# Patient Record
Sex: Female | Born: 1950 | Race: White | Hispanic: No | Marital: Married | Smoking: Current every day smoker
Health system: Southern US, Community
[De-identification: ages and names within clinical notes are randomized; demographics above are authoritative.]

## PROBLEM LIST (undated history)

## (undated) DIAGNOSIS — I10 Essential (primary) hypertension: Secondary | ICD-10-CM

---

## 2014-04-21 ENCOUNTER — Encounter: Payer: Self-pay | Admitting: *Deleted

## 2014-04-21 ENCOUNTER — Emergency Department (INDEPENDENT_AMBULATORY_CARE_PROVIDER_SITE_OTHER): Payer: BLUE CROSS/BLUE SHIELD

## 2014-04-21 ENCOUNTER — Emergency Department
Admission: EM | Admit: 2014-04-21 | Discharge: 2014-04-21 | Disposition: A | Discharge status: Home / Self Care | Payer: BLUE CROSS/BLUE SHIELD | Source: Home / Self Care | Attending: Emergency Medicine | Admitting: Emergency Medicine

## 2014-04-21 DIAGNOSIS — S93401A Sprain of unspecified ligament of right ankle, initial encounter: Secondary | ICD-10-CM | POA: Diagnosis not present

## 2014-04-21 DIAGNOSIS — S92901A Unspecified fracture of right foot, initial encounter for closed fracture: Secondary | ICD-10-CM

## 2014-04-21 DIAGNOSIS — X58XXXA Exposure to other specified factors, initial encounter: Secondary | ICD-10-CM

## 2014-04-21 DIAGNOSIS — S93409A Sprain of unspecified ligament of unspecified ankle, initial encounter: Secondary | ICD-10-CM | POA: Diagnosis not present

## 2014-04-21 DIAGNOSIS — S92002A Unspecified fracture of left calcaneus, initial encounter for closed fracture: Secondary | ICD-10-CM

## 2014-04-21 DIAGNOSIS — M25572 Pain in left ankle and joints of left foot: Secondary | ICD-10-CM

## 2014-04-21 MED ORDER — IBUPROFEN 200 MG PO TABS: ORAL_TABLET | ORAL | Status: DC

## 2014-04-21 NOTE — ED Provider Notes (Signed)
CSN: [id number]     Arrival date & time 04/21/14  1202 History   First MD Initiated Contact with Patient 04/21/14 1246     Chief Complaint  Patient presents with  . Foot Injury    L  . Ankle Injury    L   (Consider location/radiation/quality/duration/timing/severity/associated sxs/prior Treatment) HPI At the time, was wearing high heels.  Pt fell going up steps 3 days ago. And has pain 2 out of 10 at rest (5/10 sharp pain with weight bearing) and swelling in her L foot and ankle. Has continued to work, try to rest it and applied ice, but still has pain and swelling. No paresthesias or weakness. Denies cardiorespiratory or GI symptoms History reviewed. No pertinent past medical history. History reviewed. No pertinent past surgical history. History reviewed. No pertinent family history. History  Substance Use Topics  . Smoking status: Current Every Day Smoker -- 1.00 packs/day    Types: Cigarettes  . Smokeless tobacco: Not on file  . Alcohol Use: No   OB History    No data available     Review of Systems  All other systems reviewed and are negative.   Allergies  Review of patient's allergies indicates no known allergies.  Home Medications   Prior to Admission medications   Medication Sig Start Date End Date Taking? Authorizing Provider  ibuprofen (ADVIL,MOTRIN) 200 MG tablet Take three tablets ( 600 milligrams total) every 6 with food as needed for pain. 04/21/14   Lajean Manesavid Massey, MD   BP 154/83 mmHg  Pulse 72  Temp(Src) 98 F (36.7 C) (Oral)  Ht 5\' 4"  (1.626 m)  Wt 123 lb (55.792 kg)  BMI 21.10 kg/m2  SpO2 98% Physical Exam  Constitutional: She is oriented to person, place, and time. She appears well-developed and well-nourished. No distress.  HENT:  Head: Normocephalic and atraumatic.  Eyes: Conjunctivae and EOM are normal. Pupils are equal, round, and reactive to light. No scleral icterus.  Neck: Normal range of motion.  Cardiovascular: Normal rate.    Pulmonary/Chest: Effort normal.  Abdominal: She exhibits no distension.  Neurological: She is alert and oriented to person, place, and time.  Skin: Skin is warm.  Psychiatric: She has a normal mood and affect.  Nursing note and vitals reviewed.  Musculoskeletal: Painful tender swollen ecchymotic diffusely left foot and left lateral ankle. Pain exacerbated by attempts at range of motion, she has decreased range of motion. Neurovascular distally intact. ED Course  Procedures (including critical care time) Labs Review Labs Reviewed - No data to display  Imaging Review Dg Ankle Complete Left  04/21/2014   CLINICAL DATA:  Pain lt ft at Lateral malleolus and across base of anterior metatarsals after a fall 3 days ago. Initial encounter both ankle and foot viewed today.  EXAM: LEFT ANKLE COMPLETE - 3+ VIEW  COMPARISON:  None.  FINDINGS: There is no evidence of fracture, dislocation, or joint effusion. There is no evidence of arthropathy or other focal bone abnormality. Soft tissues are unremarkable.  IMPRESSION: Negative.   Electronically Signed   By: Amie Portlandavid  Ormond M.D.   On: 04/21/2014 13:55   Dg Foot Complete Left  04/21/2014   CLINICAL DATA:  Pain lt ft at Lateral malleolus and across base of anterior metatarsals after a fall 3 days ago. Initial encounter both ankle and foot viewed today.  EXAM: LEFT FOOT - COMPLETE 3+ VIEW  COMPARISON:  None.  FINDINGS: Subtle avulsion fracture noted along the lateral margin of the anterior  calcaneus with adjacent soft tissue swelling.  No other evidence of a fracture. Joints are normally spaced and aligned.  IMPRESSION: Small avulsion fracture from the lateral anterior aspect of the calcaneus.   Electronically Signed   By: Amie Portland M.D.   On: 04/21/2014 13:54     MDM   1. Fracture of right foot, closed, initial encounter   2. Right ankle sprain, initial encounter    IMPRESSION: Small avulsion fracture from the lateral anterior aspect of the  calcaneus. Also clinically quite tender swollen ecchymotic fourth and fifth left metatarsal, although no fracture seen on x-ray. Treatment options discussed, as well as risks, benefits, alternatives. Patient voiced understanding and agreement with the following plans: Ace bandage Postop shoe Ibuprofen for pain She declined prescription pain med She declined crutches. She is using a cane to avoid weightbearing. Follow-up orthopedist within 1 week Precautions discussed. Red flags discussed. Questions invited and answered. Patient voiced understanding and agreement.    Lajean Manes, MD 04/21/14 2233

## 2014-04-21 NOTE — ED Notes (Signed)
Pt fell going up her steps 3 days ago. And has pain (2/10weight bearing) and swelling in her L foot and ankle.

## 2016-08-30 ENCOUNTER — Encounter: Payer: Self-pay | Admitting: *Deleted

## 2016-08-30 ENCOUNTER — Emergency Department
Admission: EM | Admit: 2016-08-30 | Discharge: 2016-08-30 | Disposition: A | Discharge status: Home / Self Care | Payer: BLUE CROSS/BLUE SHIELD | Source: Home / Self Care | Attending: Family Medicine | Admitting: Family Medicine

## 2016-08-30 ENCOUNTER — Emergency Department (INDEPENDENT_AMBULATORY_CARE_PROVIDER_SITE_OTHER): Payer: BLUE CROSS/BLUE SHIELD

## 2016-08-30 DIAGNOSIS — S93402A Sprain of unspecified ligament of left ankle, initial encounter: Secondary | ICD-10-CM | POA: Diagnosis not present

## 2016-08-30 DIAGNOSIS — M25572 Pain in left ankle and joints of left foot: Secondary | ICD-10-CM

## 2016-08-30 DIAGNOSIS — S93602A Unspecified sprain of left foot, initial encounter: Secondary | ICD-10-CM

## 2016-08-30 NOTE — ED Triage Notes (Signed)
Pt c/o LT foot and ankle pain post fall today at 0915. Took IBF at 1400 today.

## 2016-08-30 NOTE — ED Provider Notes (Signed)
[facility]    CSN: [id number] Arrival date & time: 08/30/16  1731     History   Chief Complaint Chief Complaint  Patient presents with  . Foot Pain    HPI Courtney Nunez is a 66 y.o. female.   Patient stepped off a curb about 9 hours ago and inverted her left foot/ankle.  She has had persistent pain in her left ankle and top of foot.    Ankle Pain  Location:  Ankle and foot Time since incident:  9 hours Injury: yes   Mechanism of injury comment:  Inverted foot/ankle Ankle location:  L ankle Foot location:  Dorsum of L foot Pain details:    Quality:  Aching   Radiates to:  Does not radiate   Severity:  Moderate   Onset quality:  Sudden   Duration:  9 hours   Timing:  Constant   Progression:  Unchanged Chronicity:  Recurrent Prior injury to area:  Yes Relieved by:  None tried Worsened by:  Bearing weight Ineffective treatments:  NSAIDs Associated symptoms: decreased ROM, stiffness and swelling   Associated symptoms: no back pain, no numbness and no tingling     History reviewed. No pertinent past medical history.  There are no active problems to display for this patient.   History reviewed. No pertinent surgical history.  OB History    No data available       Home Medications    Prior to Admission medications   Medication Sig Start Date End Date Taking? Authorizing Provider  ibuprofen (ADVIL,MOTRIN) 200 MG tablet Take three tablets ( 600 milligrams total) every 6 with food as needed for pain. 04/21/14   Lajean ManesMassey, David, MD    Family History Family History  Problem Relation Age of Onset  . Cancer Father   . Heart attack Brother     Social History Social History  Substance Use Topics  . Smoking status: Current Every Day Smoker    Packs/day: 1.00    Types: Cigarettes  . Smokeless tobacco: Never Used  . Alcohol use No     Allergies   Patient has no known allergies.   Review of Systems Review of Systems  Musculoskeletal:  Positive for stiffness. Negative for back pain.  All other systems reviewed and are negative.    Physical Exam Triage Vital Signs ED Triage Vitals  Enc Vitals Group     BP 08/30/16 1758 (!) 154/80     Pulse Rate 08/30/16 1758 70     Resp 08/30/16 1758 16     Temp 08/30/16 1758 98 F (36.7 C)     Temp Source 08/30/16 1758 Oral     SpO2 08/30/16 1758 96 %     Weight 08/30/16 1759 130 lb (59 kg)     Height 08/30/16 1759 5\' 4"  (1.626 m)     Head Circumference --      Peak Flow --      Pain Score 08/30/16 1759 7     Pain Loc --      Pain Edu? --      Excl. in GC? --    No data found.   Updated Vital Signs BP (!) 154/80 (BP Location: Left Arm)   Pulse 70   Temp 98 F (36.7 C) (Oral)   Resp 16   Ht 5\' 4"  (1.626 m)   Wt 130 lb (59 kg)   SpO2 96%   BMI 22.31 kg/m   Visual Acuity Right Eye Distance:  Left Eye Distance:   Bilateral Distance:    Right Eye Near:   Left Eye Near:    Bilateral Near:     Physical Exam  Constitutional: She appears well-developed and well-nourished. No distress.  HENT:  Head: Normocephalic.  Eyes: Pupils are equal, round, and reactive to light. Conjunctivae are normal.  Neck: Normal range of motion.  Cardiovascular: Normal rate.   Pulmonary/Chest: Effort normal.  Musculoskeletal:       Left ankle: She exhibits decreased range of motion. She exhibits no swelling, no ecchymosis, no deformity, no laceration and normal pulse. Tenderness. Lateral malleolus and proximal fibula tenderness found. No medial malleolus, no AITFL, no CF ligament and no head of 5th metatarsal tenderness found. Achilles tendon normal.       Left foot: There is tenderness and bony tenderness. There is no swelling, normal capillary refill and no deformity.       Feet:  Left ankle:  Decreased range of motion.  Tenderness but no swelling over the lateral malleolus.  Tenderness extends to dorsal foot over tarsals.  Ankle joint stable.  No tenderness over the base of the  fifth metatarsal.  Distal neurovascular function is intact.   Neurological: She is alert.  Skin: Skin is warm and dry.  Nursing note and vitals reviewed.    UC Treatments / Results  Labs (all labs ordered are listed, but only abnormal results are displayed) Labs Reviewed - No data to display  EKG  EKG Interpretation None       Radiology Dg Ankle Complete Left  Result Date: 08/30/2016 CLINICAL DATA:  66 year old female with trauma to the left ankle. EXAM: LEFT FOOT - COMPLETE 3+ VIEW; LEFT ANKLE COMPLETE - 3+ VIEW COMPARISON:  Left foot radiograph dated 04/21/2014 FINDINGS: There is no acute fracture or dislocation. The ankle mortise is intact. There is for or no significant soft tissue swelling. IMPRESSION: Negative. Electronically Signed   By: Elgie Collard M.D.   On: 08/30/2016 18:38   Dg Foot Complete Left  Result Date: 08/30/2016 CLINICAL DATA:  66 year old female with trauma to the left ankle. EXAM: LEFT FOOT - COMPLETE 3+ VIEW; LEFT ANKLE COMPLETE - 3+ VIEW COMPARISON:  Left foot radiograph dated 04/21/2014 FINDINGS: There is no acute fracture or dislocation. The ankle mortise is intact. There is for or no significant soft tissue swelling. IMPRESSION: Negative. Electronically Signed   By: Elgie Collard M.D.   On: 08/30/2016 18:38    Procedures Procedures (including critical care time)  Medications Ordered in UC Medications - No data to display   Initial Impression / Assessment and Plan / UC Course  I have reviewed the triage vital signs and the nursing notes.  Pertinent labs & imaging results that were available during my care of the patient were reviewed by me and considered in my medical decision making (see chart for details).    Applied ace wrap and AirCast stirrup splint. Apply ice pack for 30 minutes every 1 to 2 hours today and tomorrow.  Elevate.  Wear Ace wrap until swelling decreases.  Wear brace for about 2 to 3 weeks.  Begin range of motion and  stretching exercises in about 5 days as per instruction sheet.  May take Ibuprofen 200mg , 4 tabs every 8 hours with food.  Followup with Dr. Rodney Langton or Dr. Clementeen [facility] ([facility]) if not improving about two weeks.     Final Clinical Impressions(s) / UC Diagnoses   Final diagnoses:  Sprain of left  ankle, unspecified ligament, initial encounter  Foot sprain, left, initial encounter    New Prescriptions New Prescriptions   No medications on file     Lattie HawBeese, Stephen A, MD 08/31/16 1100

## 2016-08-30 NOTE — Discharge Instructions (Signed)
Apply ice pack for 30 minutes every 1 to 2 hours today and tomorrow.  Elevate.  Wear Ace wrap until swelling decreases.  Wear brace for about 2 to 3 weeks.  Begin range of motion and stretching exercises in about 5 days as per instruction sheet.  May take Ibuprofen 200mg , 4 tabs every 8 hours with food.

## 2016-11-26 ENCOUNTER — Encounter: Payer: Self-pay | Admitting: *Deleted

## 2016-11-26 ENCOUNTER — Emergency Department
Admission: EM | Admit: 2016-11-26 | Discharge: 2016-11-26 | Disposition: A | Discharge status: Home / Self Care | Payer: BLUE CROSS/BLUE SHIELD | Source: Home / Self Care | Attending: Family Medicine | Admitting: Family Medicine

## 2016-11-26 DIAGNOSIS — S0181XA Laceration without foreign body of other part of head, initial encounter: Secondary | ICD-10-CM

## 2016-11-26 DIAGNOSIS — S61411A Laceration without foreign body of right hand, initial encounter: Secondary | ICD-10-CM

## 2016-11-26 DIAGNOSIS — Z23 Encounter for immunization: Secondary | ICD-10-CM

## 2016-11-26 MED ORDER — LIDOCAINE-EPINEPHRINE-TETRACAINE (LET) SOLUTION
3.0000 mL | Freq: Once | NASAL | Status: AC
  Administered 2016-11-26: 3 mL via TOPICAL

## 2016-11-26 MED ORDER — TETANUS-DIPHTH-ACELL PERTUSSIS 5-2.5-18.5 LF-MCG/0.5 IM SUSP
0.5000 mL | Freq: Once | INTRAMUSCULAR | Status: AC
  Administered 2016-11-26: 0.5 mL via INTRAMUSCULAR

## 2016-11-26 NOTE — ED Provider Notes (Signed)
[facility]    CSN: [id number] Arrival date & time: 11/26/16  1016     History   Chief Complaint Chief Complaint  Patient presents with  . Laceration  . Fall    HPI Courtney Nunez is a 66 y.o. female.   HPI Courtney Nunez is a 66 y.o. female presenting to UC with c/o laceration to the Right side of her face and Right hand that occurred this morning.  Pt was carrying a crock pot, slipped on her porch, hitting her face on the ground.  The lid of the crock pot shattered so she believes that is what cut her face and hand.  Denies LOC.  She does have partial dentures and notes they did come out during the fall but denies jaw soreness or gum bleeding.  Denies HA, nausea or change in vision. She is not on blood thinners.    History reviewed. No pertinent past medical history.  There are no active problems to display for this patient.   History reviewed. No pertinent surgical history.  OB History    No data available       Home Medications    Prior to Admission medications   Not on File    Family History Family History  Problem Relation Age of Onset  . Cancer Father   . Heart attack Brother     Social History Social History  Substance Use Topics  . Smoking status: Current Every Day Smoker    Packs/day: 1.00    Types: Cigarettes  . Smokeless tobacco: Never Used  . Alcohol use No     Allergies   Patient has no known allergies.   Review of Systems Review of Systems  HENT: Positive for facial swelling.   Eyes: Negative for pain, redness and visual disturbance.  Cardiovascular: Negative for chest pain and palpitations.  Musculoskeletal: Negative for arthralgias and joint swelling.  Skin: Positive for color change and wound.  Neurological: Negative for dizziness, light-headedness and headaches.     Physical Exam Triage Vital Signs ED Triage Vitals  Enc Vitals Group     BP 11/26/16 1057 (!) 153/83     Pulse Rate 11/26/16 1057 71     Resp  --      Temp --      Temp src --      SpO2 11/26/16 1057 98 %     Weight 11/26/16 1058 139 lb (63 kg)     Height --      Head Circumference --      Peak Flow --      Pain Score 11/26/16 1058 2     Pain Loc --      Pain Edu? --      Excl. in GC? --    No data found.   Updated Vital Signs BP (!) 153/83 (BP Location: Left Arm)   Pulse 71   Wt 139 lb (63 kg)   SpO2 98%   BMI 23.86 kg/m   Visual Acuity Right Eye Distance:   Left Eye Distance:   Bilateral Distance:    Right Eye Near:   Left Eye Near:    Bilateral Near:     Physical Exam  Constitutional: She is oriented to person, place, and time. She appears well-developed and well-nourished. No distress.  HENT:  Head: Normocephalic. Head is with contusion and with laceration.    Nose: Nose normal. No nose lacerations, sinus tenderness, nasal deformity, septal deviation or nasal septal hematoma.  Faint ecchymosis and mild edema under Right eye.  3cm laceration. No foreign body noted. Bleeding controlled.  Eyes: EOM are normal.  Neck: Normal range of motion.  Cardiovascular: Normal rate.   Pulmonary/Chest: Effort normal.  Musculoskeletal: Normal range of motion.  Neurological: She is alert and oriented to person, place, and time.  Skin: Skin is warm and dry. She is not diaphoretic.  Psychiatric: She has a normal mood and affect. Her behavior is normal.  Nursing note and vitals reviewed.    UC Treatments / Results  Labs (all labs ordered are listed, but only abnormal results are displayed) Labs Reviewed - No data to display  EKG  EKG Interpretation None       Radiology No results found.  Procedures Procedures (including critical care time)  Laceration to Right side of face on cheek sutured closed by PA-student Corwin LevinsSelby Rouch with verbal permission by the patient under my Waylan Rocher(Arvon Schreiner, PA-C) direct supervision. Discussed risk of scaring and poor wound healing with pt.  Wound draped in sterile fashion. LET  applied for 15 minutes providing moderate pain relief.  Additional 1mL Lidocaine 1% w/o epi used for complete anesthetic effect.  Wound flushed with sterile saline. Full depth of wound explored. No foreign bodies visualized or palpated. No muscle involvement.  Wound sutured closed with five 5-0 Ethylon sutures. Bacitracin and bandage applied. Pt tolerated procedure well, no immediate complications.    Wound on Right hand cleaned with warm water and Hibiclens. No foreign bodies seen or palpated. Wound is superficial. No indication for wound closure at this time. Bacitracin and bandage applied.   Medications Ordered in UC Medications  lidocaine-EPINEPHrine-tetracaine (LET) solution (3 mLs Topical Given 11/26/16 1144)  Tdap (BOOSTRIX) injection 0.5 mL (0.5 mLs Intramuscular Given 11/26/16 1144)     Initial Impression / Assessment and Plan / UC Course  I have reviewed the triage vital signs and the nursing notes.  Pertinent labs & imaging results that were available during my care of the patient were reviewed by me and considered in my medical decision making (see chart for details).     Slip and fall at home with Right side facial laceration. Pt is alert, oriented and cooperative. No LOC. No nausea, vomiting, dizziness or change in vision. Not on blood thinners. No indication for imaging at this time.  Wound on face sutured closed as detailed above. Tdap updated in UC today Return in 4-5 days for suture removal. hom care instruction packet provided. F/u sooner if needed.   Final Clinical Impressions(s) / UC Diagnoses   Final diagnoses:  Facial laceration, initial encounter  Superficial laceration of right hand, initial encounter    New Prescriptions There are no discharge medications for this patient.    Controlled Substance Prescriptions [place] Controlled Substance Registry consulted? Not Applicable   Lurene Shadowhelps, Taronda Comacho O, PA-C 11/26/16 1224

## 2016-11-26 NOTE — ED Triage Notes (Signed)
Patient reports falling on concrete this AM. She had her arms full with a crock pot, she fell face first.  The crock pot lid shattered. She has a laceration to her right cheek bone and her right hand. TDaP is unknown.

## 2016-11-28 ENCOUNTER — Telehealth: Payer: Self-pay | Admitting: Emergency Medicine

## 2016-11-28 NOTE — Telephone Encounter (Signed)
Patient states her lacerations and bruises are doing well. She is uncertain where she will have sutures removed.

## 2018-07-08 IMAGING — DX DG ANKLE COMPLETE 3+V*L*
3 series · 3 of 3 positions shown · non-contrast
Comparison: Left foot radiograph dated 04/21/2014

CLINICAL DATA: 66-year-old female with trauma to the left ankle.

EXAM:
LEFT FOOT - COMPLETE 3+ VIEW; LEFT ANKLE COMPLETE - 3+ VIEW

[ankle ap]
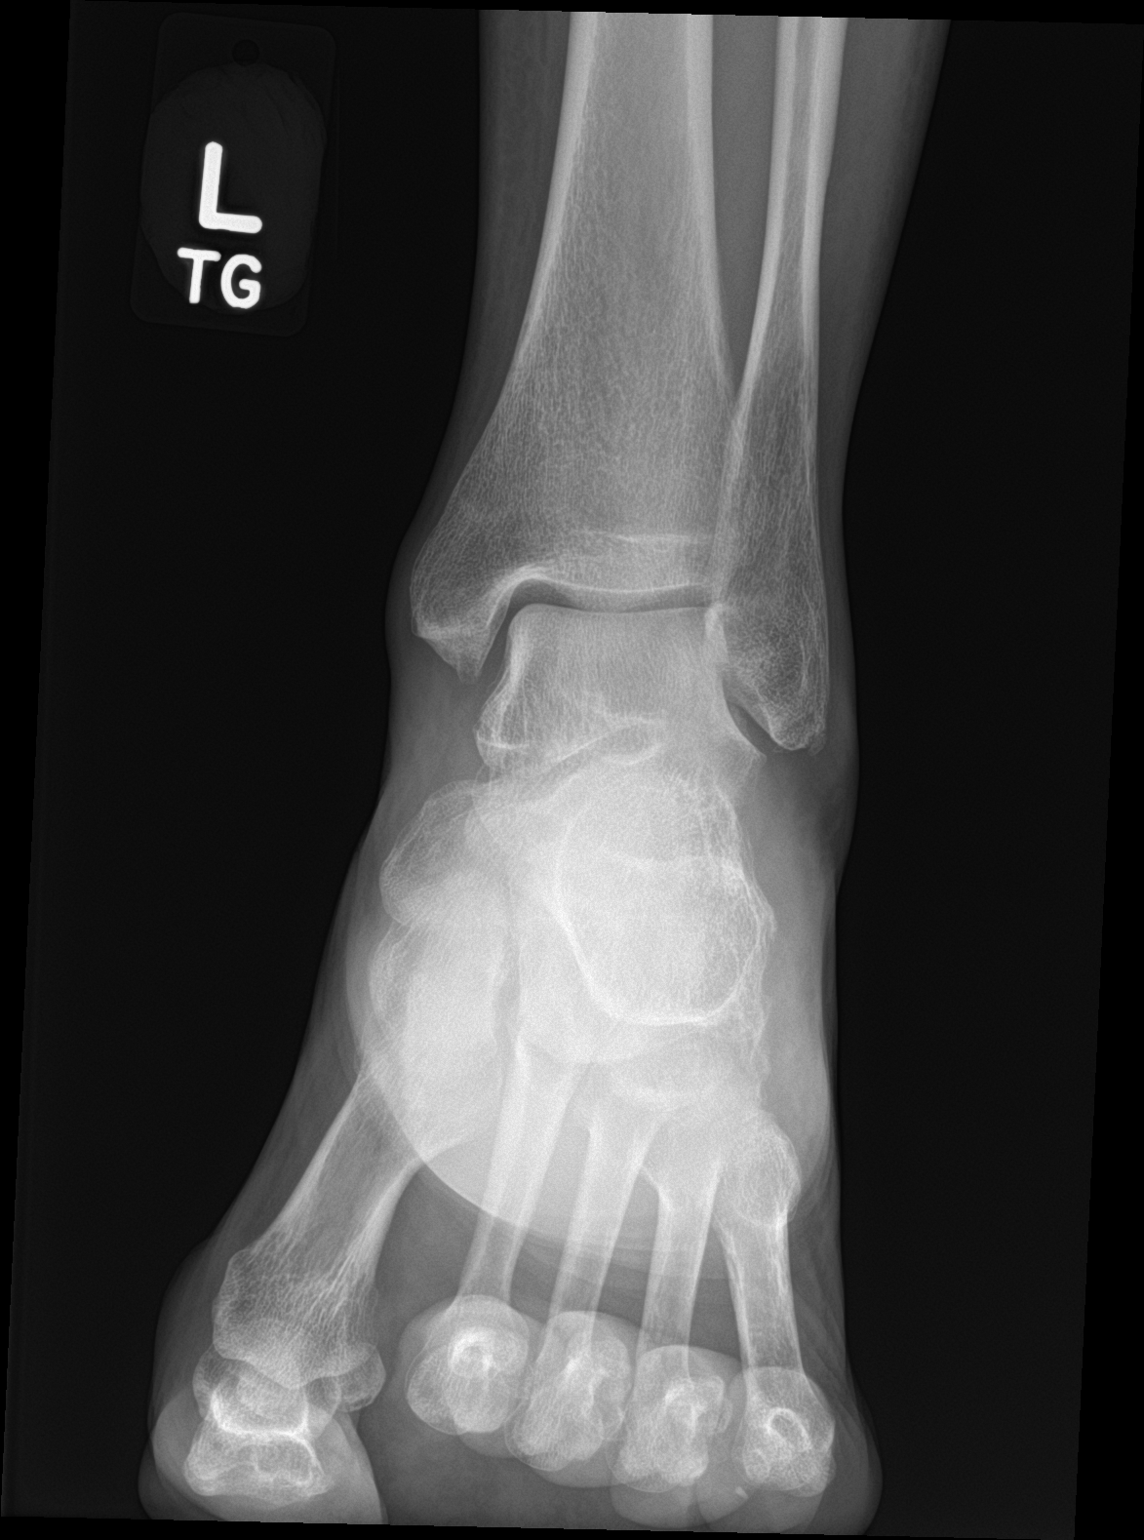

[ankle obl]
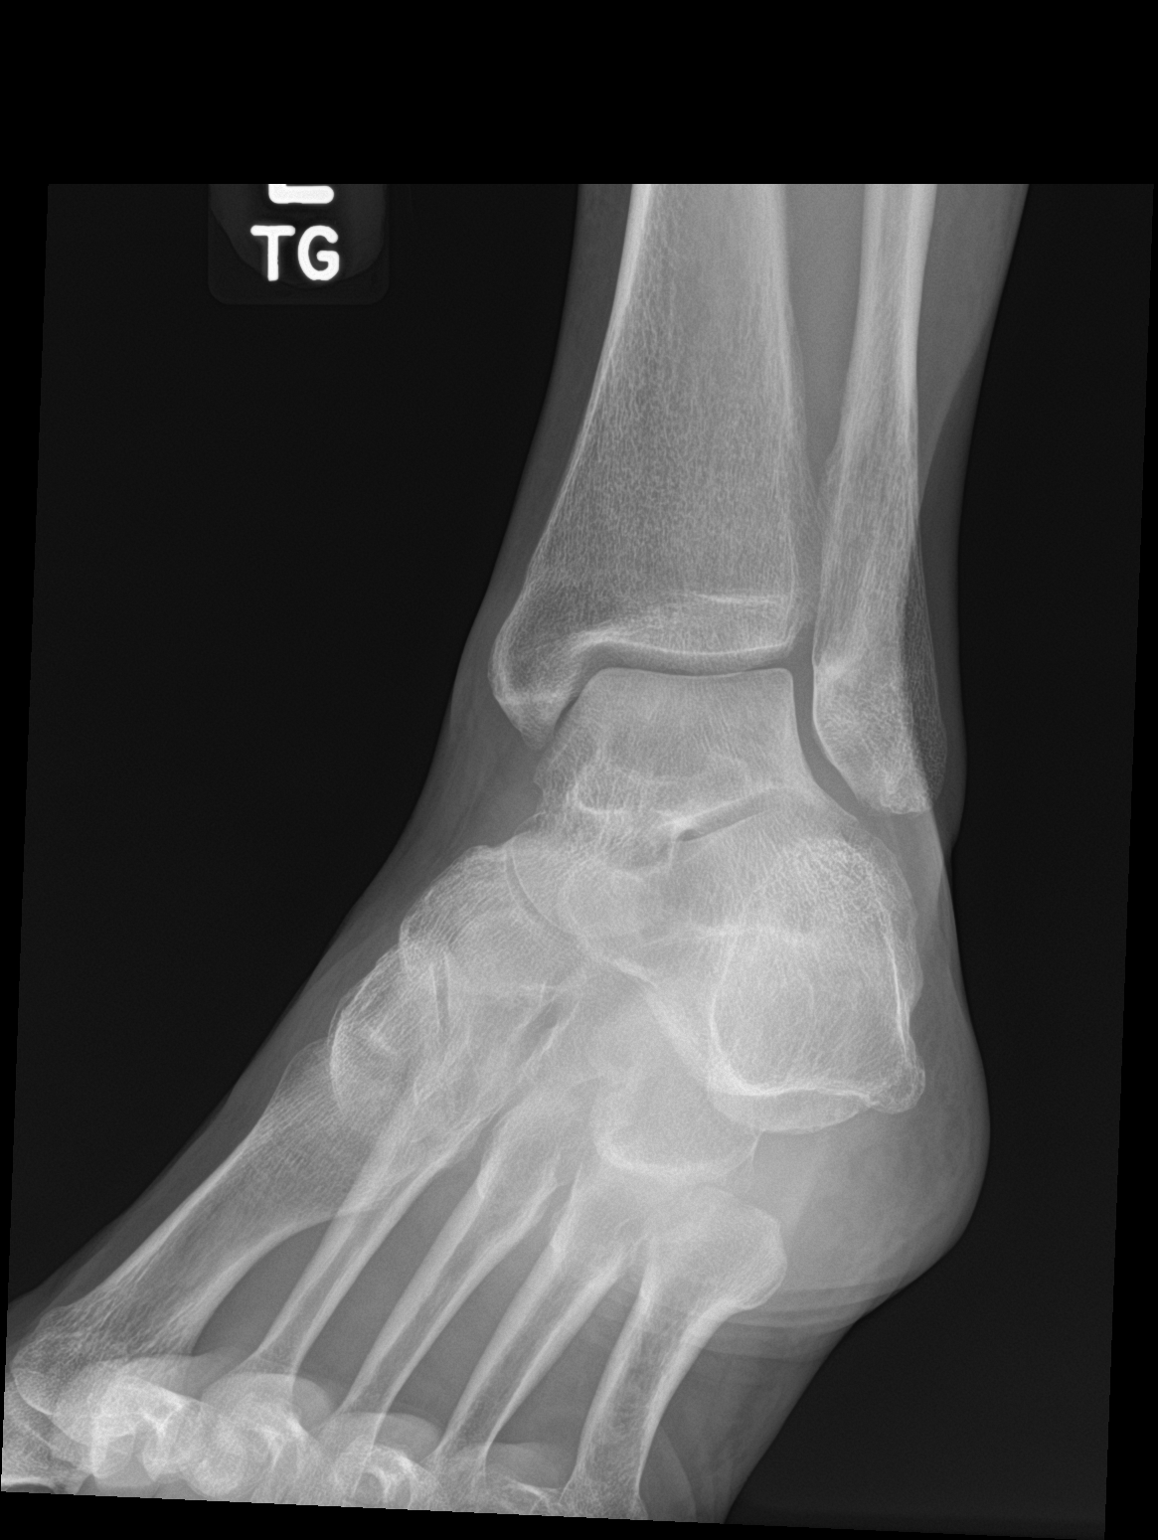

[ankle lat]
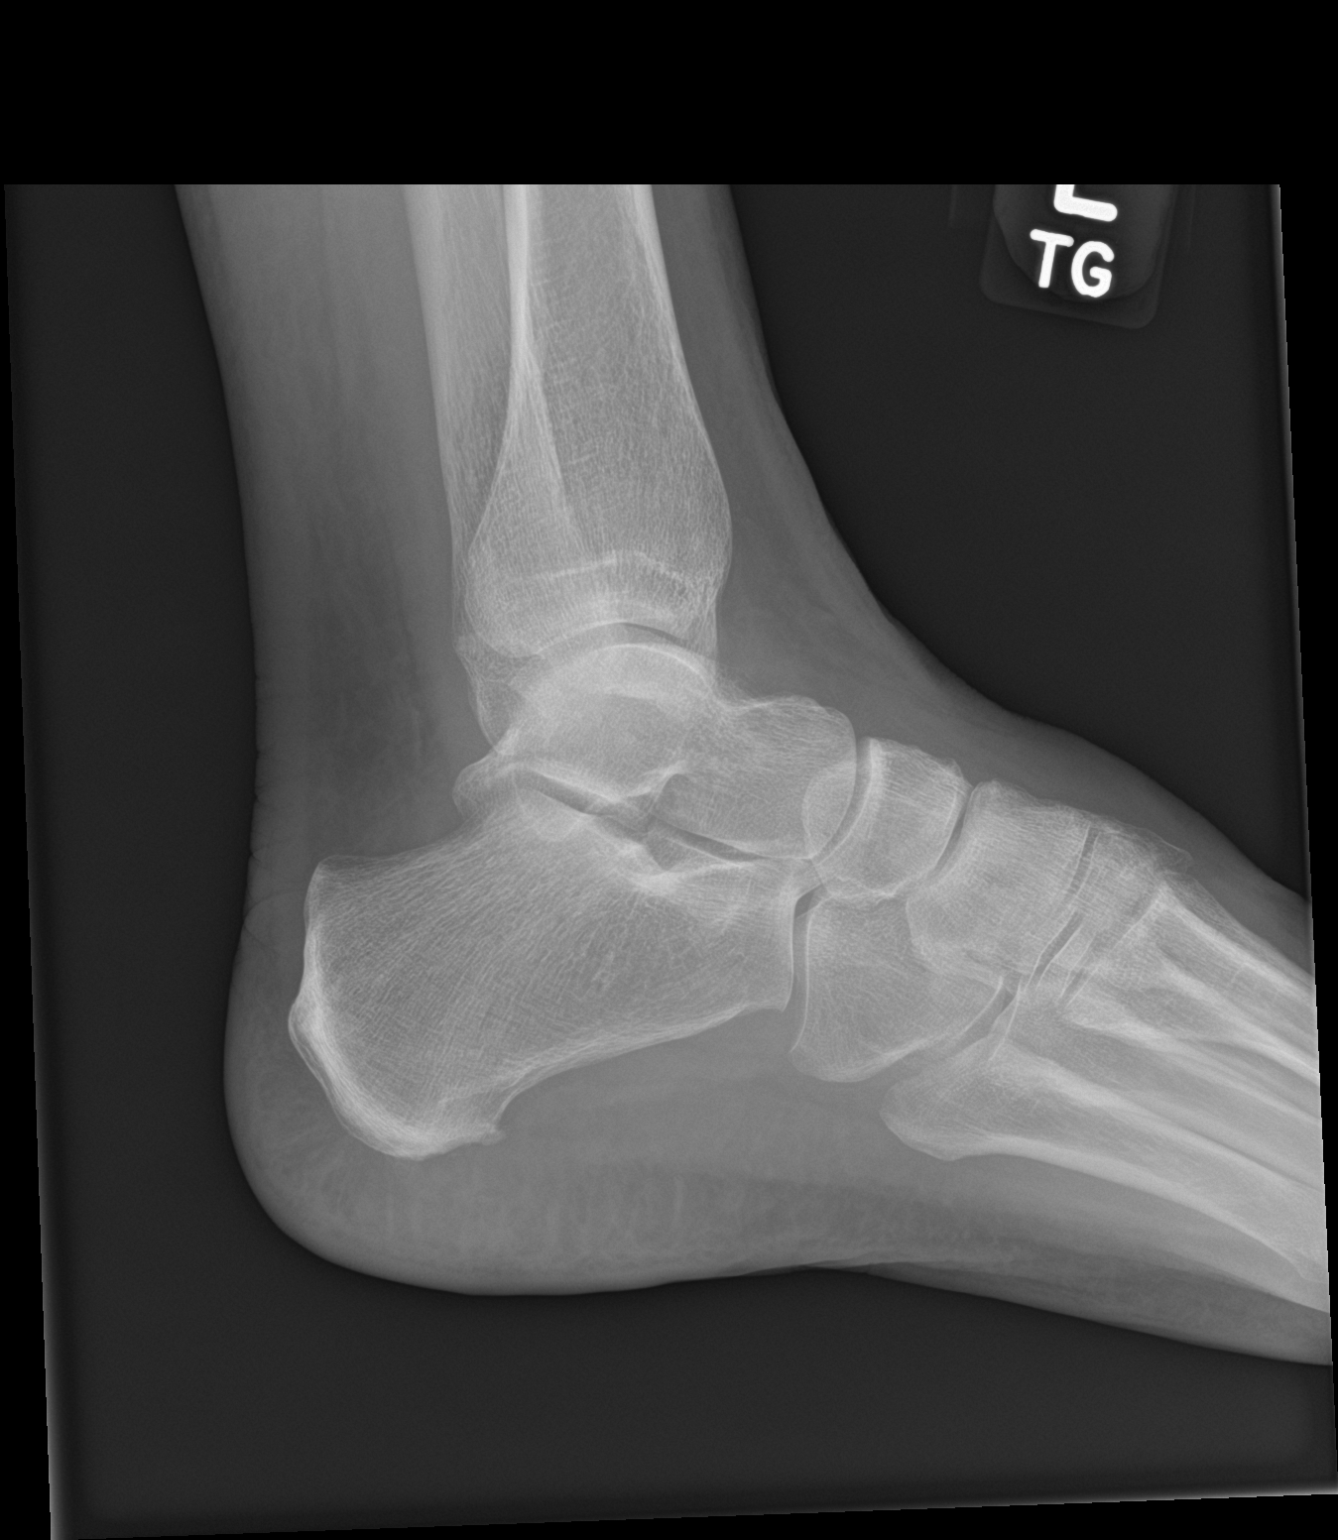

[3 of 3 positions shown; findings below may reference images not displayed]

FINDINGS: There is no acute fracture or dislocation. The ankle mortise is
intact. There is for or no significant soft tissue swelling.
IMPRESSION: Negative.

## 2018-07-08 IMAGING — DX DG FOOT COMPLETE 3+V*L*
3 series · 3 of 3 positions shown · non-contrast
Comparison: Left foot radiograph dated 04/21/2014

CLINICAL DATA: 66-year-old female with trauma to the left ankle.

EXAM:
LEFT FOOT - COMPLETE 3+ VIEW; LEFT ANKLE COMPLETE - 3+ VIEW

[foot ap]
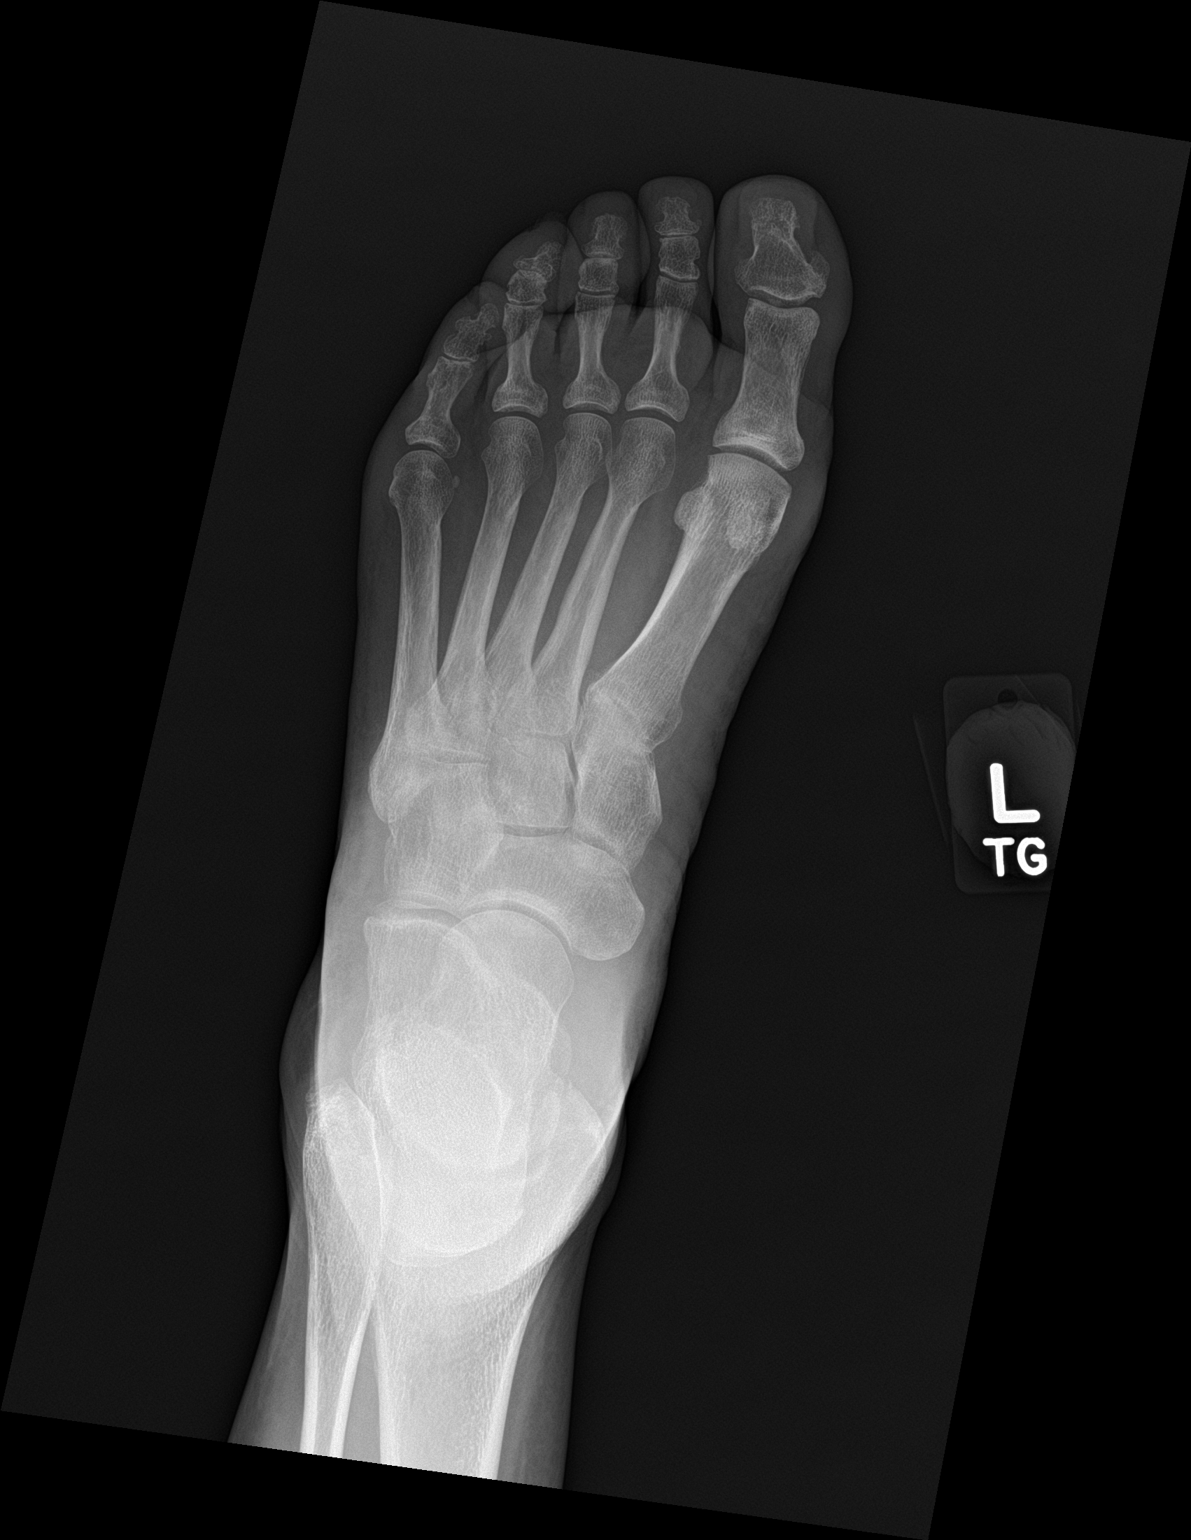

[foot obl]
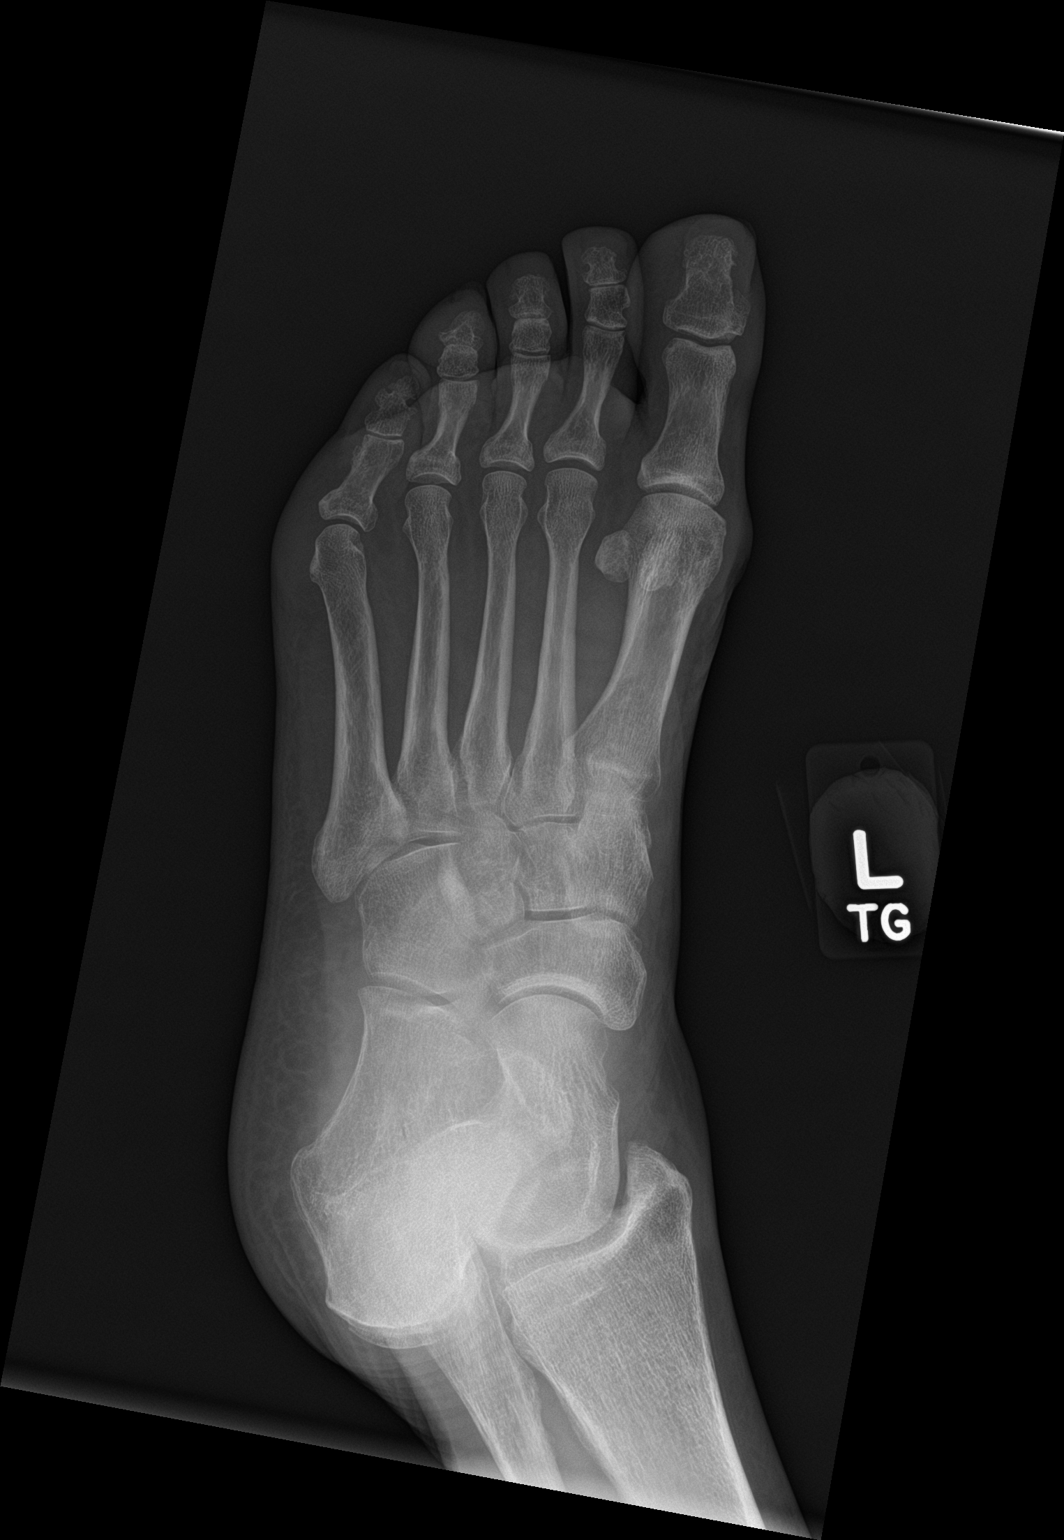

[foot lat]
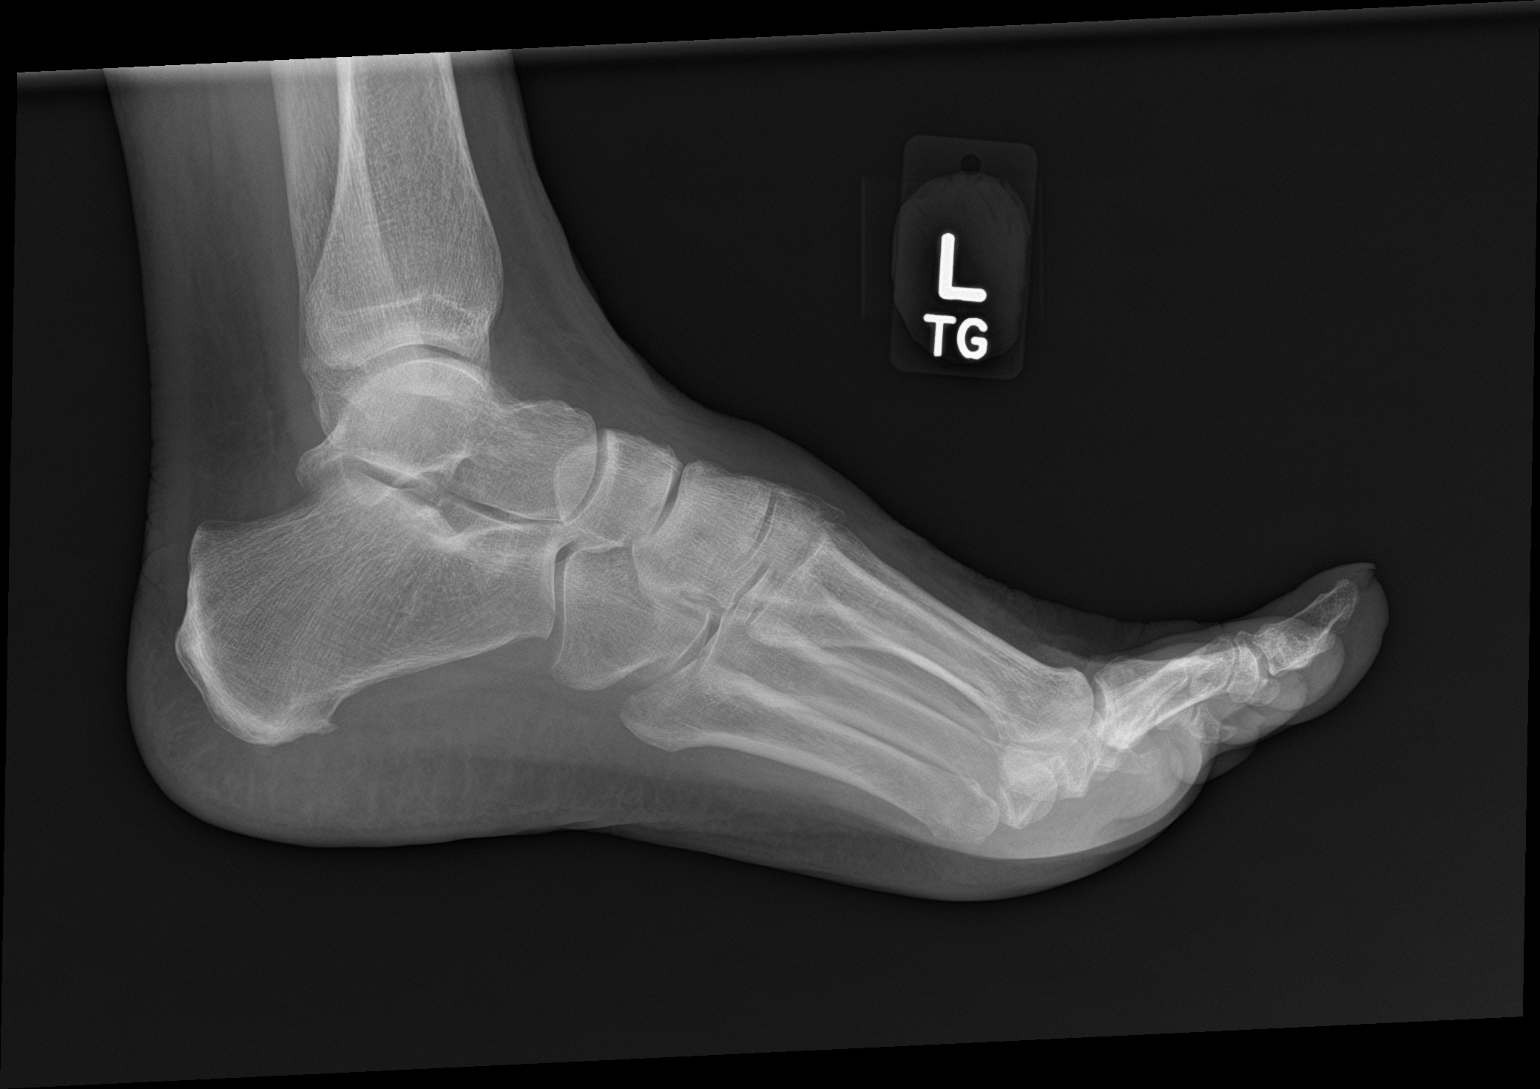

[3 of 3 positions shown; findings below may reference images not displayed]

FINDINGS: There is no acute fracture or dislocation. The ankle mortise is
intact. There is for or no significant soft tissue swelling.
IMPRESSION: Negative.

## 2021-06-14 ENCOUNTER — Emergency Department (INDEPENDENT_AMBULATORY_CARE_PROVIDER_SITE_OTHER): Payer: Medicare PPO

## 2021-06-14 ENCOUNTER — Emergency Department (INDEPENDENT_AMBULATORY_CARE_PROVIDER_SITE_OTHER)
Admission: EM | Admit: 2021-06-14 | Discharge: 2021-06-14 | Disposition: A | Discharge status: Home / Self Care | Payer: Medicare HMO | Source: Home / Self Care | Attending: Family Medicine | Admitting: Family Medicine

## 2021-06-14 ENCOUNTER — Encounter: Payer: Self-pay | Admitting: Emergency Medicine

## 2021-06-14 DIAGNOSIS — M79645 Pain in left finger(s): Secondary | ICD-10-CM

## 2021-06-14 DIAGNOSIS — M7989 Other specified soft tissue disorders: Secondary | ICD-10-CM | POA: Diagnosis not present

## 2021-06-14 DIAGNOSIS — S62653A Nondisplaced fracture of medial phalanx of left middle finger, initial encounter for closed fracture: Secondary | ICD-10-CM

## 2021-06-14 HISTORY — DX: Essential (primary) hypertension: I10

## 2021-06-14 MED ORDER — DOXYCYCLINE HYCLATE 100 MG PO CAPS: ORAL_CAPSULE | ORAL | 0 refills | Status: AC

## 2021-06-14 NOTE — ED Provider Notes (Signed)
?[facility] ? ? ? ?CSN: [id number] ?Arrival date & time: 06/14/21  [id number] ? ? ?  ? ?History   ?Chief Complaint ?Chief Complaint  ?Patient presents with  ? Finger Injury  ?  Left middle finger  ? ? ?HPI ?Courtney Nunez is a 71 y.o. female.  ? ?Patient reports that she bumped the dorsum of her left third finger PIP joint two days ago on a metal object.  She has had persistent pain and gradually increasing swelling over the PIP joint.  She wears a ring on the proximal phalanx that she has not been able to remove. ? ?The history is provided by the patient.  ?Hand Pain ?This is a new problem. The current episode started 2 days ago. The problem occurs constantly. The problem has been gradually worsening. Exacerbated by: finger flexion/extension. Nothing relieves the symptoms. She has tried nothing for the symptoms.  ? ?Past Medical History:  ?Diagnosis Date  ? Hypertension   ? ? ?There are no problems to display for this patient. ? ? ?History reviewed. No pertinent surgical history. ? ?OB History   ?No obstetric history on file. ?  ? ? ? ?Home Medications   ? ?Prior to Admission medications   ?Medication Sig Start Date End Date Taking? Authorizing Provider  ?doxycycline (VIBRAMYCIN) 100 MG capsule Take one cap PO Q12hr with food. 06/14/21  Yes Kandra Nicolas, MD  ?losartan (COZAAR) 50 MG tablet Take 50 mg by mouth daily. 05/18/21  Yes [provider]  ? ? ?Family History ?Family History  ?Problem Relation Age of Onset  ? Cancer Father   ? Heart attack Brother   ? ? ?Social History ?Social History  ? ?Tobacco Use  ? Smoking status: Every Day  ?  Packs/day: 1.00  ?  Types: Cigarettes  ? Smokeless tobacco: Never  ?Vaping Use  ? Vaping Use: Never used  ?Substance Use Topics  ? Alcohol use: No  ? Drug use: No  ? ? ? ?Allergies   ?Patient has no known allergies. ? ? ?Review of Systems ?Review of Systems  ?Constitutional:  Negative for chills, diaphoresis and fever.  ?Musculoskeletal:  Positive for joint  swelling.  ?Skin:  Positive for wound. Negative for color change.  ?All other systems reviewed and are negative. ? ? ?Physical Exam ?Triage Vital Signs ?ED Triage Vitals  ?Enc Vitals Group  ?   BP 06/14/21 0842 (!) 160/88  ?   Pulse Rate 06/14/21 0842 76  ?   Resp 06/14/21 0842 18  ?   Temp 06/14/21 0842 98.4 ?F (36.9 ?C)  ?   Temp Source 06/14/21 0842 Oral  ?   SpO2 06/14/21 0842 99 %  ?   Weight --   ?   Height --   ?   Head Circumference --   ?   Peak Flow --   ?   Pain Score 06/14/21 0840 2  ?   Pain Loc --   ?   Pain Edu? --   ?   Excl. in [place]? --   ? ?No data found. ? ?Updated Vital Signs ?BP (!) 160/88 (BP Location: Right Arm)   Pulse 76   Temp 98.4 ?F (36.9 ?C) (Oral)   Resp 18   SpO2 99%  ? ?Visual Acuity ?Right Eye Distance:   ?Left Eye Distance:   ?Bilateral Distance:   ? ?Right Eye Near:   ?Left Eye Near:    ?Bilateral Near:    ? ?Physical Exam ?  Vitals and nursing note reviewed.  ?Constitutional:   ?   General: She is not in acute distress. ?HENT:  ?   Head: Normocephalic.  ?Eyes:  ?   Pupils: Pupils are equal, round, and reactive to light.  ?Cardiovascular:  ?   Rate and Rhythm: Normal rate.  ?Pulmonary:  ?   Effort: Pulmonary effort is normal.  ?Musculoskeletal:  ?     Hands: ? ?   Comments: Left 3rd finger:  diffuse mild swelling.  Tenderness and localized swelling dorsally over PIP joint.  Distal neurovascular function is intact.  Note presence of ring on proximal phalanx with distal swelling.  Limited range of motion of the PIP joint because of swelling but flexion/extension is intact.  ?Skin: ?   General: Skin is warm and dry.  ?Neurological:  ?   Mental Status: She is alert.  ? ? ? ?UC Treatments / Results  ?Labs ?(all labs ordered are listed, but only abnormal results are displayed) ?Labs Reviewed - No data to display ? ?EKG ? ? ?Radiology ?DG Finger Middle Left ? ?Result Date: 06/14/2021 ?CLINICAL DATA:  Trauma 2 PIP joint 2 days ago. Decreased range of motion. Redness and swelling. EXAM: LEFT  MIDDLE FINGER 2+V COMPARISON:  None Available. FINDINGS: There is diffuse soft tissue swelling. Thin curvilinear lucency through the medial base of the third middle phalanx is identified for which a nondisplaced fracture cannot be excluded. No additional suspicious bone abnormalities. No radio-opaque foreign bodies or soft tissue coughs of occasions. IMPRESSION: 1. Suspect nondisplaced fracture involving the medial base of the third middle phalanx. Recommend correlation for any focal tenderness over this area. Immobilization with repeat imaging in 7-10 days may be helpful to confirm. 2. Diffuse soft tissue swelling. Electronically Signed   By: Kerby Moors M.D.   On: 06/14/2021 09:44   ? ?Procedures ?Procedures  Ring removal: ?Using a ring cutter, easily cut and removed ring from proximal phalanx of the left third finger.  Patient tolerated well. ? ?Medications Ordered in UC ?Medications - No data to display ? ?Initial Impression / Assessment and Plan / UC Course  ?I have reviewed the triage vital signs and the nursing notes. ? ?Pertinent labs & imaging results that were available during my care of the patient were reviewed by me and considered in my medical decision making (see chart for details). ? ?  ?Splint applied.   ?Suspect early developing cellulitis dorsum of the PIP joint. Begin doxycycline. ?X-ray report:  Suspect nondisplaced fracture involving the medial base of the third middle phalanx. ?Followup with sports medicine in 8 days for repeat x-ray. ? ?Final Clinical Impressions(s) / UC Diagnoses  ? ?Final diagnoses:  ?Closed nondisplaced fracture of middle phalanx of left middle finger, initial encounter  ? ? ? ?Discharge Instructions   ? ?  ?Wear splint.  Elevate hand as much as possible.  May take Tylenol if needed for pain. ?If symptoms become significantly worse during the night or over the weekend, proceed to the local emergency room.  ? ? ? ? ?ED Prescriptions   ? ? Medication Sig Dispense Auth.  Provider  ? doxycycline (VIBRAMYCIN) 100 MG capsule Take one cap PO Q12hr with food. 14 capsule Kandra Nicolas, MD  ? ?  ? ? ?  ?Kandra Nicolas, MD ?06/15/21 1348 ? ?

## 2021-06-14 NOTE — ED Triage Notes (Signed)
Patient presents to Urgent Care with complaints of hit her left middle finger at the knuckle since 2 days ago. Patient reports having swelling of the middle finger. Limited mobility with bending.  ? ?

## 2021-06-14 NOTE — Discharge Instructions (Signed)
Wear splint.  Elevate hand as much as possible.  May take Tylenol if needed for pain. ?If symptoms become significantly worse during the night or over the weekend, proceed to the local emergency room.  ?

## 2021-06-24 ENCOUNTER — Ambulatory Visit (HOSPITAL_BASED_OUTPATIENT_CLINIC_OR_DEPARTMENT_OTHER)
Admission: RE | Admit: 2021-06-24 | Discharge: 2021-06-24 | Disposition: A | Discharge status: Home / Self Care | Payer: Medicare HMO | Source: Ambulatory Visit | Attending: Family Medicine | Admitting: Family Medicine

## 2021-06-24 ENCOUNTER — Encounter: Payer: Self-pay | Admitting: Family Medicine

## 2021-06-24 ENCOUNTER — Ambulatory Visit (INDEPENDENT_AMBULATORY_CARE_PROVIDER_SITE_OTHER): Payer: Medicare HMO | Admitting: Family Medicine

## 2021-06-24 VITALS — BP 150/98 | Ht 64.0 in | Wt 126.0 lb

## 2021-06-24 DIAGNOSIS — S62629A Displaced fracture of medial phalanx of unspecified finger, initial encounter for closed fracture: Secondary | ICD-10-CM | POA: Insufficient documentation

## 2021-06-24 DIAGNOSIS — M795 Residual foreign body in soft tissue: Secondary | ICD-10-CM | POA: Diagnosis not present

## 2021-06-24 NOTE — Assessment & Plan Note (Signed)
Acutely occurring.  Had a trauma around 5/14.  Has no malrotation or misalignment. -Counseled on home exercise therapy and supportive care. -Counseled on buddy taping. -X-ray.

## 2021-06-24 NOTE — Patient Instructions (Signed)
Nice to meet you Please try buddy taping for another two weeks.  I will call with the xray results.   Please send me a message in MyChart with any questions or updates.  Please see me back as needed.   --Dr. Jordan Likes

## 2021-06-24 NOTE — Assessment & Plan Note (Signed)
Acutely occurring.  She has completed a course of doxycycline.  She is able to express a piece of wood from the soft tissue today.  Having improvement of her pain after doing this. -Counseled on supportive care.

## 2021-06-24 NOTE — Progress Notes (Signed)
  Courtney Nunez - 71 y.o. female MRN [id number]  Date of birth: 06/04/1950  SUBJECTIVE:  Including CC & ROS.  No chief complaint on file.   Courtney Nunez is a 71 y.o. female that is presenting with left middle finger pain.  She had an injury a few days ago and had pain and swelling over the PIP joint.  This morning she was able to express a foreign body out of the skin.  She had completed a course of doxycycline.  She still has limited range of motion of the PIP joint..  Review of the urgent care note from 5/14 shows she was placed in a splint. Independent review of left middle finger x-ray from 5/14 shows a suspected nondisplaced fracture at the base of the middle phalanx.  Review of Systems See HPI   HISTORY: Past Medical, Surgical, Social, and Family History Reviewed & Updated per EMR.   Pertinent Historical Findings include:  Past Medical History:  Diagnosis Date   Hypertension     History reviewed. No pertinent surgical history.   PHYSICAL EXAM:  VS: BP (!) 150/98 (BP Location: Right Arm, Patient Position: Sitting)   Ht 5\' 4"  (1.626 m)   Wt 126 lb (57.2 kg)   BMI 21.63 kg/m  Physical Exam Gen: NAD, alert, cooperative with exam, well-appearing MSK:  Neurovascularly intact       ASSESSMENT & PLAN:   Closed fracture of base of middle phalanx of finger Acutely occurring.  Had a trauma around 5/14.  Has no malrotation or misalignment. -Counseled on home exercise therapy and supportive care. -Counseled on buddy taping. -X-ray.   Foreign body (FB) in soft tissue Acutely occurring.  She has completed a course of doxycycline.  She is able to express a piece of wood from the soft tissue today.  Having improvement of her pain after doing this. -Counseled on supportive care.

## 2022-05-17 ENCOUNTER — Encounter: Payer: Self-pay | Admitting: *Deleted
